# Patient Record
Sex: Female | Born: 1995 | Race: White | Hispanic: No | Marital: Single | Smoking: Never smoker
Health system: Southern US, Community
[De-identification: ages and names within clinical notes are randomized; demographics above are authoritative.]

---
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
## 2016-04-01 ENCOUNTER — Other Ambulatory Visit: Payer: Self-pay | Admitting: Family Medicine

## 2016-04-01 ENCOUNTER — Ambulatory Visit
Admission: RE | Admit: 2016-04-01 | Discharge: 2016-04-01 | Disposition: A | Discharge status: Home / Self Care | Payer: BLUE CROSS/BLUE SHIELD | Source: Ambulatory Visit | Attending: Family Medicine | Admitting: Family Medicine

## 2016-04-01 DIAGNOSIS — R059 Cough, unspecified: Secondary | ICD-10-CM

## 2016-04-01 DIAGNOSIS — R05 Cough: Secondary | ICD-10-CM

## 2016-04-01 DIAGNOSIS — R0602 Shortness of breath: Secondary | ICD-10-CM

## 2016-04-01 DIAGNOSIS — R61 Generalized hyperhidrosis: Secondary | ICD-10-CM | POA: Insufficient documentation

## 2016-04-02 LAB — CBC WITH DIFFERENTIAL/PLATELET
Basophils Absolute: 0 10*3/uL (ref 0.0–0.2)
Basos: 0 %
EOS (ABSOLUTE): 0.2 10*3/uL (ref 0.0–0.4)
Eos: 1 %
HEMATOCRIT: 38.9 % (ref 34.0–46.6)
Hemoglobin: 13.3 g/dL (ref 11.1–15.9)
IMMATURE GRANS (ABS): 0 10*3/uL (ref 0.0–0.1)
IMMATURE GRANULOCYTES: 0 %
LYMPHS: 16 %
Lymphocytes Absolute: 2.4 10*3/uL (ref 0.7–3.1)
MCH: 31.4 pg (ref 26.6–33.0)
MCHC: 34.2 g/dL (ref 31.5–35.7)
MCV: 92 fL (ref 79–97)
MONOS ABS: 0.7 10*3/uL (ref 0.1–0.9)
Monocytes: 5 %
NEUTROS ABS: 11.5 10*3/uL — AB (ref 1.4–7.0)
NEUTROS PCT: 78 %
Platelets: 253 10*3/uL (ref 150–379)
RBC: 4.24 x10E6/uL (ref 3.77–5.28)
RDW: 13.1 % (ref 12.3–15.4)
WBC: 14.9 10*3/uL — ABNORMAL HIGH (ref 3.4–10.8)

## 2016-04-02 LAB — COMPREHENSIVE METABOLIC PANEL
A/G RATIO: 1.9 (ref 1.2–2.2)
ALT: 20 IU/L (ref 0–32)
AST: 20 IU/L (ref 0–40)
Albumin: 4.6 g/dL (ref 3.5–5.5)
Alkaline Phosphatase: 78 IU/L (ref 43–101)
BUN / CREAT RATIO: 33 — AB (ref 9–23)
BUN: 22 mg/dL — ABNORMAL HIGH (ref 6–20)
Bilirubin Total: 0.6 mg/dL (ref 0.0–1.2)
CALCIUM: 9.6 mg/dL (ref 8.7–10.2)
CO2: 23 mmol/L (ref 18–29)
Chloride: 99 mmol/L (ref 96–106)
Creatinine, Ser: 0.67 mg/dL (ref 0.57–1.00)
GFR, EST AFRICAN AMERICAN: 148 mL/min/{1.73_m2} (ref >59)
GFR, EST NON AFRICAN AMERICAN: 129 mL/min/{1.73_m2} (ref >59)
GLOBULIN, TOTAL: 2.4 g/dL (ref 1.5–4.5)
Glucose: 90 mg/dL (ref 65–99)
POTASSIUM: 4.8 mmol/L (ref 3.5–5.2)
SODIUM: 140 mmol/L (ref 134–144)
TOTAL PROTEIN: 7 g/dL (ref 6.0–8.5)

## 2016-04-02 LAB — MONONUCLEOSIS SCREEN: Mono Screen: NEGATIVE

## 2016-04-02 LAB — D-DIMER, QUANTITATIVE

## 2017-04-24 ENCOUNTER — Encounter: Payer: Self-pay | Admitting: Family Medicine

## 2017-04-24 ENCOUNTER — Ambulatory Visit (INDEPENDENT_AMBULATORY_CARE_PROVIDER_SITE_OTHER): Payer: 59 | Admitting: Family Medicine

## 2017-04-24 DIAGNOSIS — M659 Synovitis and tenosynovitis, unspecified: Secondary | ICD-10-CM

## 2017-04-24 MED ORDER — DICLOFENAC SODIUM 75 MG PO TBEC: 75.0000 mg | DELAYED_RELEASE_TABLET | Freq: Two times a day (BID) | ORAL | 0 refills | Status: AC

## 2017-04-24 NOTE — Progress Notes (Signed)
Patient presents today regarding her chronic left wrist pain. Patient states that she started to develop wrist pain back in May 2018 when she started to play more golf. She went to see a physician in [place] during the summer who diagnosed her with tenosynovitis. She was given 2 steroid injections over the summer which did not seem to help much. She states that rest and PT seemed to help her some. She went to see the same physician recently while home for fall break and was told to wear her thumb spica splint and to not play golf until December. If symptoms did not improve it was discussed that she would need surgery. She has now started to feel symptoms at rest. She states that her pain is a 4 out of 10 today. She takes Ibuprofen on occasion.  ROS: Negative except mentioned above. Vitals as per Epic.  GENERAL: NAD MSK: Left Wrist - no obvious deformity, FROM, discomfort with ulnar deviation, positive Finkelstein's, NV intact NEURO: CN II-XII groslly intact   A/P: Left wrist tenosynovitis - would recommend patient see Dr. Ardine Engiehl on Monday to review her case and discuss any other options, may consider imaging, stay in thumb spica splint for the time being, Voltaren as needed, discussed above with trainer as well.

## 2017-04-28 ENCOUNTER — Ambulatory Visit
Admission: RE | Admit: 2017-04-28 | Discharge: 2017-04-28 | Disposition: A | Discharge status: Home / Self Care | Payer: 59 | Source: Ambulatory Visit | Attending: Family Medicine | Admitting: Family Medicine

## 2017-04-28 ENCOUNTER — Other Ambulatory Visit: Payer: Self-pay | Admitting: Family Medicine

## 2017-04-28 DIAGNOSIS — M65842 Other synovitis and tenosynovitis, left hand: Secondary | ICD-10-CM | POA: Diagnosis present

## 2017-04-28 DIAGNOSIS — M659 Synovitis and tenosynovitis, unspecified: Secondary | ICD-10-CM

## 2017-04-28 DIAGNOSIS — G8929 Other chronic pain: Secondary | ICD-10-CM | POA: Insufficient documentation

## 2017-04-28 DIAGNOSIS — M65932 Unspecified synovitis and tenosynovitis, left forearm: Secondary | ICD-10-CM

## 2017-04-28 DIAGNOSIS — M25532 Pain in left wrist: Secondary | ICD-10-CM | POA: Insufficient documentation

## 2017-04-28 DIAGNOSIS — R600 Localized edema: Secondary | ICD-10-CM | POA: Diagnosis not present

## 2017-04-28 DIAGNOSIS — M67844 Other specified disorders of tendon, left hand: Secondary | ICD-10-CM | POA: Insufficient documentation

## 2017-08-06 ENCOUNTER — Ambulatory Visit (INDEPENDENT_AMBULATORY_CARE_PROVIDER_SITE_OTHER): Payer: 59 | Admitting: Family Medicine

## 2017-08-06 ENCOUNTER — Encounter: Payer: Self-pay | Admitting: Family Medicine

## 2017-08-06 VITALS — Temp 98.4°F

## 2017-08-06 DIAGNOSIS — B079 Viral wart, unspecified: Secondary | ICD-10-CM

## 2017-08-06 NOTE — Progress Notes (Signed)
Patient presents today with symptoms of lesion on her left first digit and right third digit near her nail. Patient states that she has had these lesions for over a year. She has tried the over-the-counter free spray which has not helped. She denies any other place on her body with similar lesions.  ROS: Negative except mentioned above. Vitals as per Epic. GENERAL: NAD SKIN: circularraised, firm lesion on left first digit and right third digit near nailbed, no drainage from site, no significant tenderness NEURO: CN II-XII grossly intact   A/P: Lesions on fingers - appears to look like warts, will send patient to Dermatology for further evaluation and treatment.

## 2017-11-02 ENCOUNTER — Encounter: Payer: Self-pay | Admitting: Family Medicine

## 2017-11-02 ENCOUNTER — Ambulatory Visit (INDEPENDENT_AMBULATORY_CARE_PROVIDER_SITE_OTHER): Payer: BLUE CROSS/BLUE SHIELD | Admitting: Family Medicine

## 2017-11-02 VITALS — BP 112/60 | HR 74 | Temp 98.9°F | Resp 14

## 2017-11-02 DIAGNOSIS — J029 Acute pharyngitis, unspecified: Secondary | ICD-10-CM

## 2017-11-02 DIAGNOSIS — R5383 Other fatigue: Secondary | ICD-10-CM

## 2017-11-02 NOTE — Progress Notes (Signed)
Patient presents today with symptoms of sore throat, fatigue, nasal congestion. Patient states that her symptoms started last week. She states that she went to student health and was tested for strep throat which was negative. The provider suggested that she may have mono if her symptoms persisted but it was too early to test for at that time. Patient has had some decreased appetite and nausea. She has not had any abdominal pain. She did describe chills at times. She has been taking fever lowering medication occasionally. She has been able to go to class but has not been able to do any physical activity. Patient states that her boyfriend did have infectious mono. recently.  ROS: Negative except mentioned above. Vitals as per Epic. GENERAL: NAD HEENT: mild pharyngeal erythema, no exudate, no erythema of TMs, mild cervical LAD RESP: CTA B CARD: RRR ABD: positive bowel sounds, nontender, no organomegaly appreciated NEURO: CN II-XII grossly intact   A/P: Presumed Infectious Mono. - Will do labs, no physical activity for now, rest, hydration, encourage not skipping meals, schoolwork as tolerated, encourage patient to inform academic advisor/professors, Ibuprofen/Tylenol when necessary, seek medical attention if symptoms persist or worsen as discussed.will discuss with trainer.

## 2017-11-04 LAB — COMPREHENSIVE METABOLIC PANEL
ALT: 12 IU/L (ref 0–32)
AST: 14 IU/L (ref 0–40)
Albumin/Globulin Ratio: 1.8 (ref 1.2–2.2)
Albumin: 4.6 g/dL (ref 3.5–5.5)
Alkaline Phosphatase: 45 IU/L (ref 39–117)
BUN/Creatinine Ratio: 21 (ref 9–23)
BUN: 16 mg/dL (ref 6–20)
Bilirubin Total: 1 mg/dL (ref 0.0–1.2)
CO2: 23 mmol/L (ref 20–29)
CREATININE: 0.78 mg/dL (ref 0.57–1.00)
Calcium: 9.4 mg/dL (ref 8.7–10.2)
Chloride: 100 mmol/L (ref 96–106)
GFR, EST AFRICAN AMERICAN: 127 mL/min/{1.73_m2} (ref >59)
GFR, EST NON AFRICAN AMERICAN: 110 mL/min/{1.73_m2} (ref >59)
GLUCOSE: 115 mg/dL — AB (ref 65–99)
Globulin, Total: 2.5 g/dL (ref 1.5–4.5)
POTASSIUM: 4.4 mmol/L (ref 3.5–5.2)
Sodium: 139 mmol/L (ref 134–144)
TOTAL PROTEIN: 7.1 g/dL (ref 6.0–8.5)

## 2017-11-04 LAB — CBC WITH DIFFERENTIAL/PLATELET
BASOS: 0 %
Basophils Absolute: 0 10*3/uL (ref 0.0–0.2)
EOS (ABSOLUTE): 0.1 10*3/uL (ref 0.0–0.4)
Eos: 1 %
HEMOGLOBIN: 13.9 g/dL (ref 11.1–15.9)
Hematocrit: 41.9 % (ref 34.0–46.6)
IMMATURE GRANS (ABS): 0 10*3/uL (ref 0.0–0.1)
Immature Granulocytes: 0 %
LYMPHS ABS: 2 10*3/uL (ref 0.7–3.1)
LYMPHS: 25 %
MCH: 30.8 pg (ref 26.6–33.0)
MCHC: 33.2 g/dL (ref 31.5–35.7)
MCV: 93 fL (ref 79–97)
MONOCYTES: 4 %
Monocytes Absolute: 0.3 10*3/uL (ref 0.1–0.9)
NEUTROS ABS: 5.7 10*3/uL (ref 1.4–7.0)
Neutrophils: 70 %
Platelets: 293 10*3/uL (ref 150–379)
RBC: 4.52 x10E6/uL (ref 3.77–5.28)
RDW: 12.8 % (ref 12.3–15.4)
WBC: 8.1 10*3/uL (ref 3.4–10.8)

## 2017-11-04 LAB — EPSTEIN-BARR VIRUS VCA ANTIBODY PANEL
EBV Early Antigen Ab, IgG: <9 U/mL (ref 0.0–8.9)
EBV VCA IgG: 206 U/mL — ABNORMAL HIGH (ref 0.0–17.9)
EBV VCA IgM: <36 U/mL (ref 0.0–35.9)

## 2018-03-11 ENCOUNTER — Encounter: Payer: Self-pay | Admitting: Family Medicine

## 2018-03-11 ENCOUNTER — Ambulatory Visit (INDEPENDENT_AMBULATORY_CARE_PROVIDER_SITE_OTHER): Payer: 59 | Admitting: Family Medicine

## 2018-03-11 VITALS — Temp 98.1°F | Resp 14

## 2018-03-11 DIAGNOSIS — L989 Disorder of the skin and subcutaneous tissue, unspecified: Secondary | ICD-10-CM

## 2018-03-26 NOTE — Progress Notes (Signed)
Patient presents with symptoms of wart on R 3rd digit and L 1st digit. She has had them for >1 year. She would like them removed. She has tried OTC freeze products which haven't helped. She also has small ?skin tags under her bottom lip. She also admits they have been there for a long time.   ROS: Negative except mentioned above. Vitals as per Epic.  GENERAL: NAD SKIN: approx. 5 mm wart-like lesion on dorsal surface of right 3rd digit and left 1st digit, two small ?skin tags under bottom lip NEURO: CN II-XII grossly intact   A/P: Skin Lesions - will refer to Dermatology for removal, patient is not bothered with them but would like them removed for cosmetic reasons.

## 2018-04-15 ENCOUNTER — Encounter: Payer: Self-pay | Admitting: Emergency Medicine

## 2018-04-15 ENCOUNTER — Other Ambulatory Visit: Payer: Self-pay

## 2018-04-15 ENCOUNTER — Emergency Department
Admission: EM | Admit: 2018-04-15 | Discharge: 2018-04-15 | Disposition: A | Discharge status: Home / Self Care | Payer: 59 | Attending: Emergency Medicine | Admitting: Emergency Medicine

## 2018-04-15 DIAGNOSIS — R6883 Chills (without fever): Secondary | ICD-10-CM | POA: Diagnosis not present

## 2018-04-15 DIAGNOSIS — Z5321 Procedure and treatment not carried out due to patient leaving prior to being seen by health care provider: Secondary | ICD-10-CM | POA: Insufficient documentation

## 2018-04-15 DIAGNOSIS — M791 Myalgia, unspecified site: Secondary | ICD-10-CM | POA: Insufficient documentation

## 2018-04-15 LAB — COMPREHENSIVE METABOLIC PANEL
ALBUMIN: 4.5 g/dL (ref 3.5–5.0)
ALK PHOS: 44 U/L (ref 38–126)
ALT: 18 U/L (ref 0–44)
ANION GAP: 8 (ref 5–15)
AST: 17 U/L (ref 15–41)
BUN: 14 mg/dL (ref 6–20)
CALCIUM: 9.1 mg/dL (ref 8.9–10.3)
CHLORIDE: 104 mmol/L (ref 98–111)
CO2: 26 mmol/L (ref 22–32)
Creatinine, Ser: 0.68 mg/dL (ref 0.44–1.00)
GFR calc Af Amer: >60 mL/min (ref >60)
GFR calc non Af Amer: >60 mL/min (ref >60)
GLUCOSE: 90 mg/dL (ref 70–99)
POTASSIUM: 3.7 mmol/L (ref 3.5–5.1)
Sodium: 138 mmol/L (ref 135–145)
Total Bilirubin: 1 mg/dL (ref 0.3–1.2)
Total Protein: 7.3 g/dL (ref 6.5–8.1)

## 2018-04-15 LAB — CBC WITH DIFFERENTIAL/PLATELET
ABS IMMATURE GRANULOCYTES: 0.01 10*3/uL (ref 0.00–0.07)
Basophils Absolute: 0.1 10*3/uL (ref 0.0–0.1)
Basophils Relative: 1 %
Eosinophils Absolute: 0.2 10*3/uL (ref 0.0–0.5)
Eosinophils Relative: 2 %
HEMATOCRIT: 39.4 % (ref 36.0–46.0)
Hemoglobin: 13.6 g/dL (ref 12.0–15.0)
IMMATURE GRANULOCYTES: 0 %
LYMPHS ABS: 2.8 10*3/uL (ref 0.7–4.0)
LYMPHS PCT: 40 %
MCH: 31.6 pg (ref 26.0–34.0)
MCHC: 34.5 g/dL (ref 30.0–36.0)
MCV: 91.6 fL (ref 80.0–100.0)
Monocytes Absolute: 0.4 10*3/uL (ref 0.1–1.0)
Monocytes Relative: 6 %
NEUTROS ABS: 3.6 10*3/uL (ref 1.7–7.7)
NEUTROS PCT: 51 %
Platelets: 238 10*3/uL (ref 150–400)
RBC: 4.3 MIL/uL (ref 3.87–5.11)
RDW: 12 % (ref 11.5–15.5)
WBC: 7 10*3/uL (ref 4.0–10.5)
nRBC: 0 % (ref 0.0–0.2)

## 2018-04-15 NOTE — ED Triage Notes (Addendum)
Patient had MMR vaccine done today. States she has had chills with generalized body aches since then. Patient does report having syncopal episode earlier today, but it was right after having MMR vaccine given while still sitting in chair from vaccine. Patient in no acute distress.

## 2018-04-27 ENCOUNTER — Encounter: Payer: Self-pay | Admitting: Family Medicine

## 2018-04-27 ENCOUNTER — Ambulatory Visit (INDEPENDENT_AMBULATORY_CARE_PROVIDER_SITE_OTHER): Payer: Managed Care, Other (non HMO) | Admitting: Family Medicine

## 2018-04-27 VITALS — BP 92/56 | HR 86 | Temp 98.3°F | Resp 14

## 2018-04-27 DIAGNOSIS — F5001 Anorexia nervosa, restricting type: Secondary | ICD-10-CM

## 2018-05-07 ENCOUNTER — Encounter: Payer: Self-pay | Admitting: Family Medicine

## 2018-05-07 ENCOUNTER — Ambulatory Visit (INDEPENDENT_AMBULATORY_CARE_PROVIDER_SITE_OTHER): Payer: 59 | Admitting: Family Medicine

## 2018-05-07 VITALS — BP 100/57 | HR 81 | Temp 97.7°F | Resp 14

## 2018-05-07 DIAGNOSIS — F509 Eating disorder, unspecified: Secondary | ICD-10-CM

## 2018-05-07 NOTE — Progress Notes (Signed)
Patient presents today for follow-up regarding her disordered eating.  Patient states that she is done well since her last appointment with me.  She has started to advance her diet.  She admits to eating more than one meal a day now.  She is eating more salads  with protein.  She still is struggling with incorporating carbohydrates into her diet.  She did meet with Lattie Haw, the nutritionist.  Since she hasn't been playing golf this last week she states she has found more time to eat.  Patient denies any binging or purging.  She admits that since her last visit with Elberta Fortis she has not weighed herself.  She states her bowel movements are about every other day.  She denies any chest pain, shortness of breath, dizziness, headache, syncope. Patient states that she also met with Elberta Fortis in counseling services who suggested that she be seen by a disordered eating group ([facility]) in [place].  Patient states that she contacted them and did an intake over the phone.  They have not called her back to make an appointment yet.  Her parents have been hesitant for her to get formal treatment for her current issues.  She states that they are concerned that she would have to disclose any mental illness diagnosis or treatment if she were to apply to law school.  Her parents feel that she should be able to deal with these issues on her own.  She admits that her boyfriend has been very supportive of her seeking help.  ROS: Negative except mentioned above. Vitals as per Epic.  Weight was not taken. GENERAL: NAD, wearing a sweatshirt and sweatpants, normal affect RESP: CTA B CARD: RRR NEURO: CN II-XII grossly intact   A/P: Disordered Eating -recommended that I do blood work today however patient states that she would like to hold off on this until next week, when asked why she said that she did not feel "up" to getting blood work today.  Patient agreed that she would like to have Elberta Fortis talk to her parents about her  recommendation regarding her seeing the disordered eating group in [place].  She believes her parents may allow her to do this if they hear it is recommended from a counselor/therapist.  I told her I would be happy to speak with them as well if needed.  Would like patient to continue to meet with Lattie Haw for nutritional advice.  I have asked that she try to incorporate some carbohydrates into her diet that she feels comfortable with.  Would also like her to follow-up with me next week for interval changes.  Would recommend that we do blood work at her next appointment.  She will confirm with the group in [place] to see if they will be drawing any labs.  Patient is aware of the crisis line through counseling services if needed.  Patient can participate in golf activity as long as she has eaten a meal and is hydrated prior to activity.  Patient become symptomatic she is to stop athletic activity.  Patient denies any suicidal or homicidal ideations.

## 2018-05-11 ENCOUNTER — Ambulatory Visit (INDEPENDENT_AMBULATORY_CARE_PROVIDER_SITE_OTHER): Payer: Managed Care, Other (non HMO) | Admitting: Family Medicine

## 2018-05-11 VITALS — BP 115/57 | HR 84 | Temp 98.8°F | Resp 14

## 2018-05-11 DIAGNOSIS — F509 Eating disorder, unspecified: Secondary | ICD-10-CM

## 2018-05-11 MED ORDER — FLUCONAZOLE 150 MG PO TABS: 150.0000 mg | ORAL_TABLET | Freq: Once | ORAL | 0 refills | Status: AC

## 2018-05-11 NOTE — Addendum Note (Signed)
Addended by: Dione Housekeeper on: 05/11/2018 09:22 AM   Modules accepted: Level of Service

## 2018-05-12 LAB — CBC WITH DIFFERENTIAL/PLATELET
BASOS: 0 %
Basophils Absolute: 0 10*3/uL (ref 0.0–0.2)
EOS (ABSOLUTE): 0.1 10*3/uL (ref 0.0–0.4)
Eos: 2 %
Hematocrit: 40.3 % (ref 34.0–46.6)
Hemoglobin: 13.8 g/dL (ref 11.1–15.9)
IMMATURE GRANS (ABS): 0 10*3/uL (ref 0.0–0.1)
IMMATURE GRANULOCYTES: 0 %
Lymphocytes Absolute: 2 10*3/uL (ref 0.7–3.1)
Lymphs: 33 %
MCH: 31 pg (ref 26.6–33.0)
MCHC: 34.2 g/dL (ref 31.5–35.7)
MCV: 91 fL (ref 79–97)
Monocytes Absolute: 0.3 10*3/uL (ref 0.1–0.9)
Monocytes: 5 %
NEUTROS PCT: 60 %
Neutrophils Absolute: 3.7 10*3/uL (ref 1.4–7.0)
Platelets: 263 10*3/uL (ref 150–450)
RBC: 4.45 x10E6/uL (ref 3.77–5.28)
RDW: 12.2 % — ABNORMAL LOW (ref 12.3–15.4)
WBC: 6 10*3/uL (ref 3.4–10.8)

## 2018-05-12 LAB — COMPREHENSIVE METABOLIC PANEL
ALT: 14 IU/L (ref 0–32)
AST: 16 IU/L (ref 0–40)
Albumin/Globulin Ratio: 2.1 (ref 1.2–2.2)
Albumin: 4.7 g/dL (ref 3.5–5.5)
Alkaline Phosphatase: 46 IU/L (ref 39–117)
BUN/Creatinine Ratio: 16 (ref 9–23)
BUN: 14 mg/dL (ref 6–20)
Bilirubin Total: 1.1 mg/dL (ref 0.0–1.2)
CALCIUM: 9.5 mg/dL (ref 8.7–10.2)
CO2: 21 mmol/L (ref 20–29)
Chloride: 100 mmol/L (ref 96–106)
Creatinine, Ser: 0.89 mg/dL (ref 0.57–1.00)
GFR calc Af Amer: 108 mL/min/{1.73_m2} (ref >59)
GFR, EST NON AFRICAN AMERICAN: 94 mL/min/{1.73_m2} (ref >59)
Globulin, Total: 2.2 g/dL (ref 1.5–4.5)
Glucose: 107 mg/dL — ABNORMAL HIGH (ref 65–99)
Potassium: 4.1 mmol/L (ref 3.5–5.2)
Sodium: 140 mmol/L (ref 134–144)
Total Protein: 6.9 g/dL (ref 6.0–8.5)

## 2018-05-12 LAB — IRON AND TIBC
IRON SATURATION: 32 % (ref 15–55)
IRON: 117 ug/dL (ref 27–159)
Total Iron Binding Capacity: 361 ug/dL (ref 250–450)
UIBC: 244 ug/dL (ref 131–425)

## 2018-05-12 LAB — TSH+FREE T4
Free T4: 1.54 ng/dL (ref 0.82–1.77)
TSH: 2.25 u[IU]/mL (ref 0.450–4.500)

## 2018-05-12 LAB — ESTRADIOL: Estradiol: <5 pg/mL

## 2018-05-12 LAB — B12 AND FOLATE PANEL
Folate: 18.1 ng/mL (ref >3.0)
Vitamin B-12: 469 pg/mL (ref 232–1245)

## 2018-05-12 LAB — VITAMIN D 25 HYDROXY (VIT D DEFICIENCY, FRACTURES): Vit D, 25-Hydroxy: 63.8 ng/mL (ref 30.0–100.0)

## 2018-05-12 LAB — FSH/LH
FSH: 1.5 m[IU]/mL
LH: 1 m[IU]/mL

## 2018-06-14 ENCOUNTER — Ambulatory Visit (INDEPENDENT_AMBULATORY_CARE_PROVIDER_SITE_OTHER): Payer: Managed Care, Other (non HMO) | Admitting: Family Medicine

## 2018-06-14 ENCOUNTER — Encounter: Payer: Self-pay | Admitting: Family Medicine

## 2018-06-14 VITALS — BP 102/78 | HR 96 | Temp 98.1°F | Resp 14

## 2018-06-14 DIAGNOSIS — F5001 Anorexia nervosa, restricting type: Secondary | ICD-10-CM

## 2018-12-18 NOTE — Progress Notes (Signed)
Patient presents with concerns about her eating behavior. She states that for the last few months she has only been eating one meal a day. She has been avoiding carbohydrates as well. She has been feeling tired on the golf course at times which she contributes to a lack of eating. She denies any history of disordered eating in the past. She denies binging or purging. She does weigh herself daily. She admits she likes the weight she is now. She has a boyfriend who is very supportive of her seeking help. She likes the comments she gets from her friends and family regarding her current weight. She feels she has no energy to play golf though. She is concerned about long term effects given her current eating behavior. She denies alcohol or illicit drug use or taking medications to lose weight. Denies any current menstrual irregularities.   ROS: Negative except mentioned above. Vitals as per Epic.  GENERAL: NAD HEENT: no pharyngeal erythema, no exudate, no erythema of TMs, no cervical LAD RESP: CTA B CARD: RRR NEURO: CN II-XII grossly intact   A/P: Disordered Eating, Body Image Concerns - discussed some of the health issues that can arise with disordered eating, patient seemed to be concerned after discussing this, admits she wants to change her behavior, is willing to see counseling services but not a psychiatrist because she is concerned she would have to disclose this if she applied to law school. For now I am okay with her seeing Elberta Fortis in counseling services. We will discuss further evaluation and treatment once Lelan Pons has seen her. Would like her to see the nutritionist regularly as well as patient is willing to try to change her diet. Also, would like to do some blood work at our next appointment. Patient seems fine with stepping away from golf for now until she can improve her eating behavior and get her energy level back. Patient will follow-up with me after seeing Elberta Fortis. She is to seek medical  attention if any acute concerns.

## 2018-12-19 NOTE — Progress Notes (Signed)
Patient presents today for follow-up regarding her disordered eating . She says she has been doing well and has been eating 2-3 meals a day instead of one. She also is not getting on the scale anymore. Her fatigue has improved as well. She states she started changing her eating behavior because she was worried about how her restrictive behavior was affecting her health and that it could cause long term issues. Patient did decline inpatient care at the facility in [place]. She has an aunt who is a physician in [place] that she has opened up to who has been supportive with her getting help. She plans to see someone when she goes home for winter break. Her boyfriend continues to be supportive. She denies binging or purging. She admits she is happy with how her body looks. She has tried to include some carbohydrates into her meals. She has decided not to return back to golf. She has not discussed this with her coach yet but she believes he will be understanding.   ROS: Negative except mentioned above. Vitals as per Epic. GENERAL: NAD HEENT: no pharyngeal erythema, no exudate RESP: CTA B CARD: RRR NEURO: CN II-XII grossly intact   A/P: Disordered Eating - improving behavior per patient, reviewed labs from 05/2018, decreased hormone levels including estradiol, Vitamin D and iron level normal, I have encouraged her to continue seeing counseling services and/or someone at home in [place], patient agrees to do so, though she is not planning to return to golf in the spring I have encouraged her to reach out to me by email or through her trainer if she needs help, I have encouraged her to follow-up with student health services when she returns back to campus, patient addresses understanding and is appreciative.

## 2019-10-04 IMAGING — MR MR WRIST*L* W/O CM
5 of 6 series · 34 of 40 positions shown · non-contrast
Comparison: None.

CLINICAL DATA: Left wrist pain since November 2016. The patient feels a
lump on the wrist. No known injury.

EXAM:
MR OF THE LEFT WRIST WITHOUT CONTRAST
TECHNIQUE: Multiplanar, multisequence MR imaging of the left wrist was
performed. No intravenous contrast was administered.

[Series 3: T2 fat-sat · axial · 3.0mm · 0.43mm/px · z∈[-23,+55]mm · 9 of 25 slices shown]
[im 1/25]
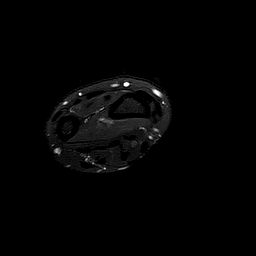
[im 4/25]
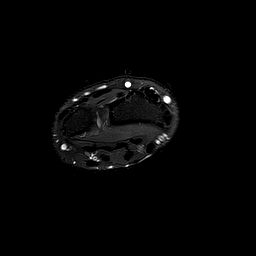
[im 7/25]
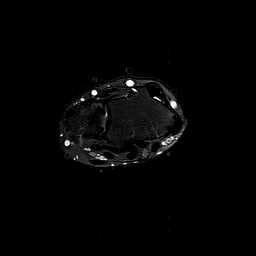
[im 10/25]
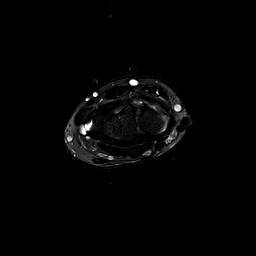
[im 13/25]
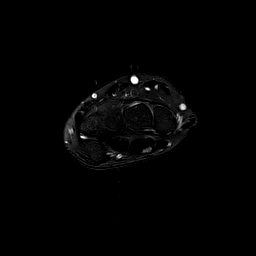
[im 16/25]
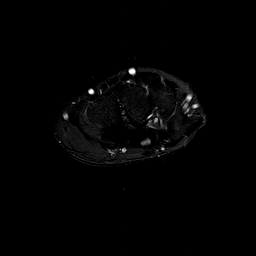
[im 19/25]
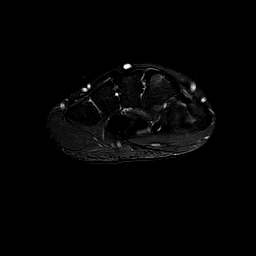
[im 22/25]
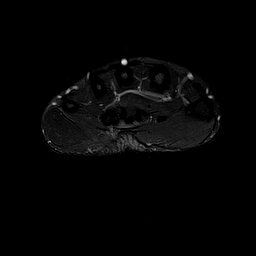
[im 25/25]
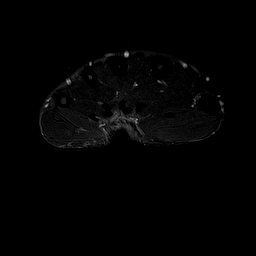

[Series 4: T1 · axial · 3.0mm · 0.43mm/px · z∈[-23,+55]mm · 8 of 25 slices shown (1 of 2)]
[im 1/25]
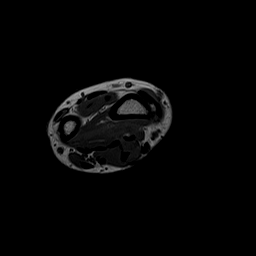
[im 4/25]
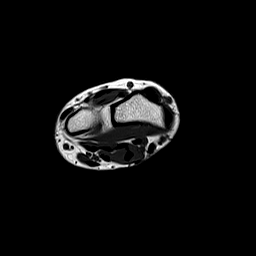
[im 7/25]
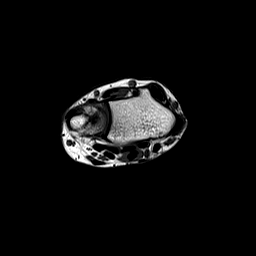
[im 11/25]
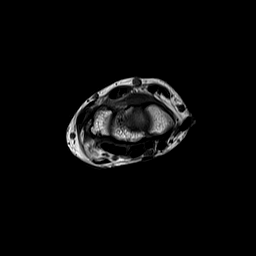
[im 14/25]
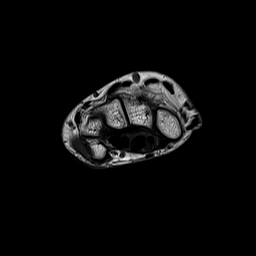
[im 18/25]
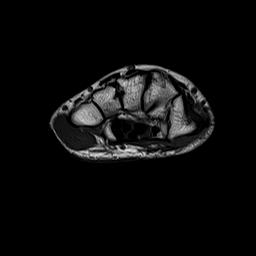
[im 21/25]
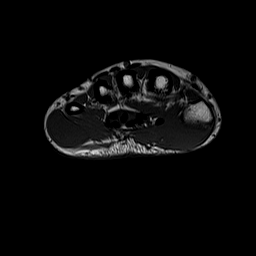
[im 25/25]
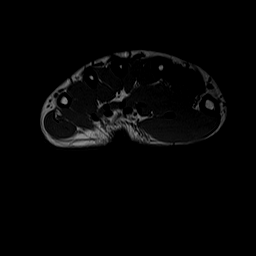

[Series 5: T1 · coronal · 3.0mm · 0.43mm/px · 4 of 17 slices shown (2 of 2)]
[im 1/17]
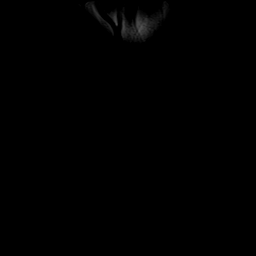
[im 5/17]
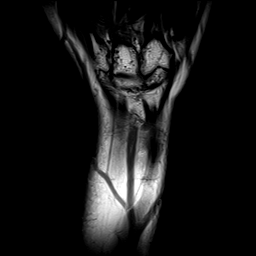
[im 9/17]
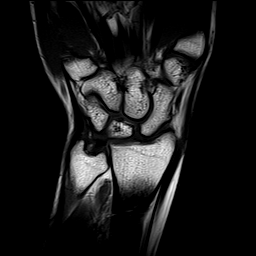
[im 13/17]
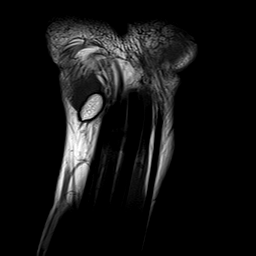

[Series 6: PD fat-sat · coronal · 3.0mm · 0.21mm/px · 5 of 17 slices shown (1 of 2)]
[im 1/17]
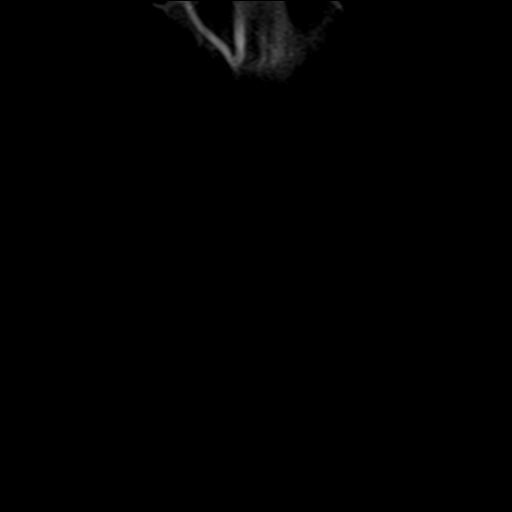
[im 5/17]
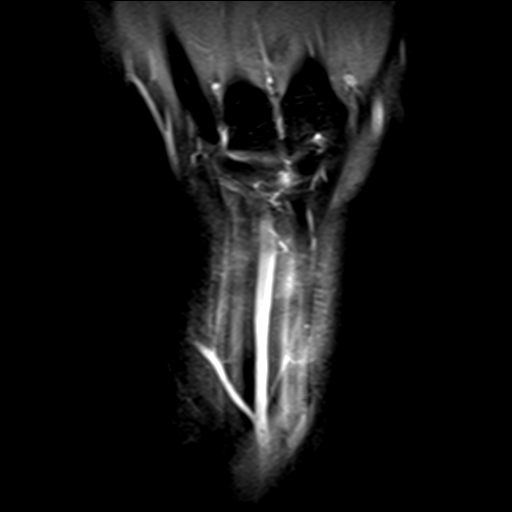
[im 9/17]
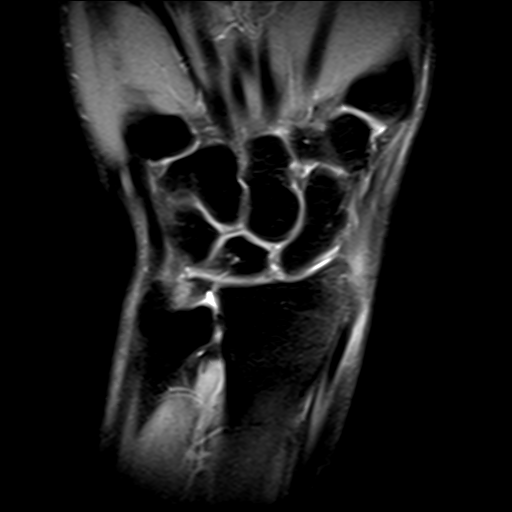
[im 13/17]
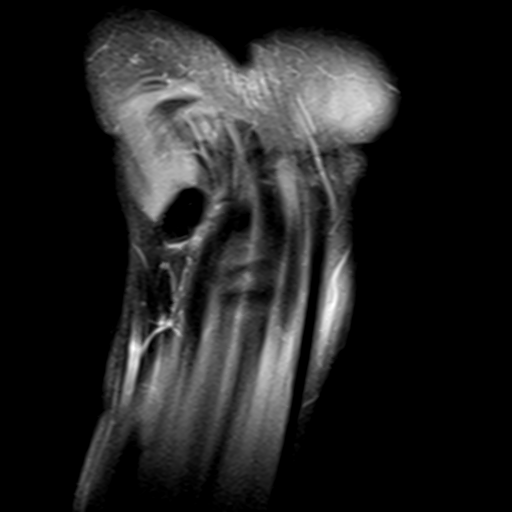
[im 17/17]
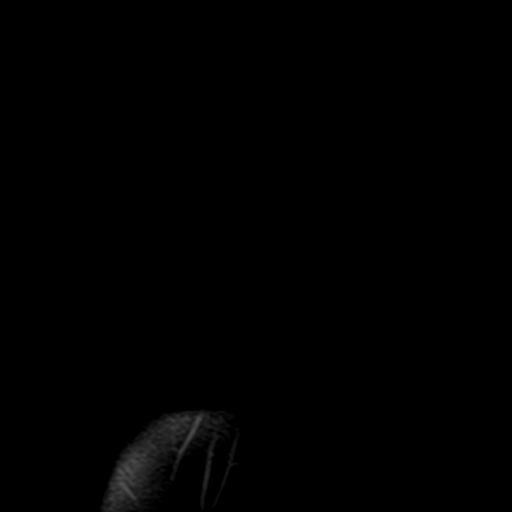

[Series 7: PD fat-sat · sagittal · 3.0mm · 0.21mm/px · 8 of 26 slices shown (2 of 2)]
[im 1/26]
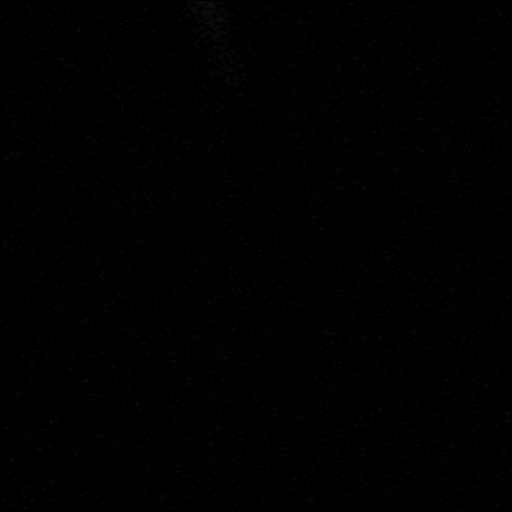
[im 4/26]
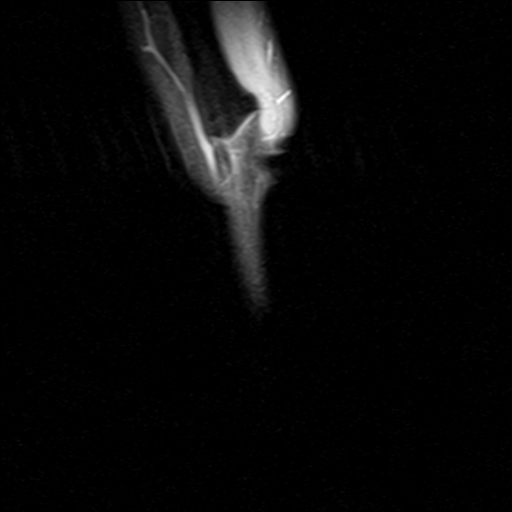
[im 8/26]
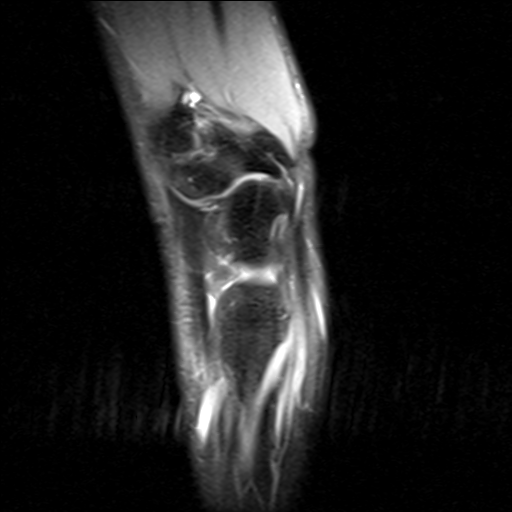
[im 11/26]
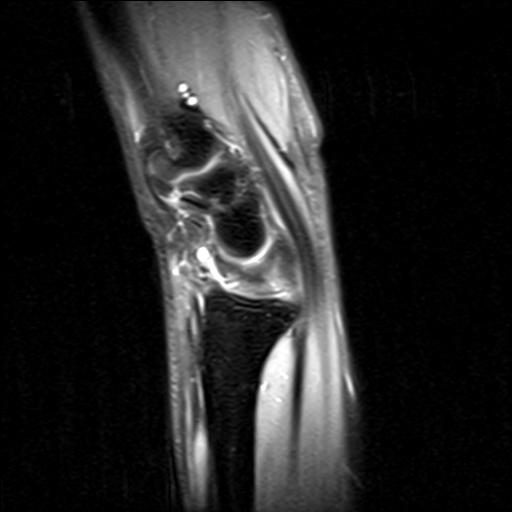
[im 15/26]
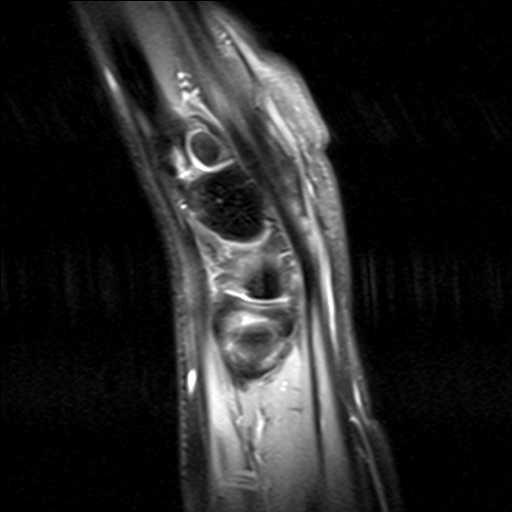
[im 18/26]
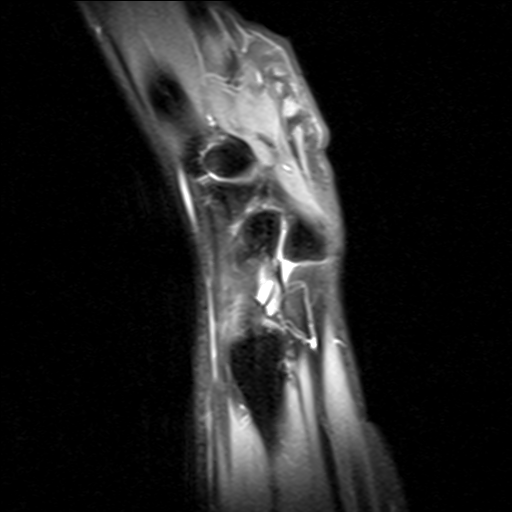
[im 22/26]
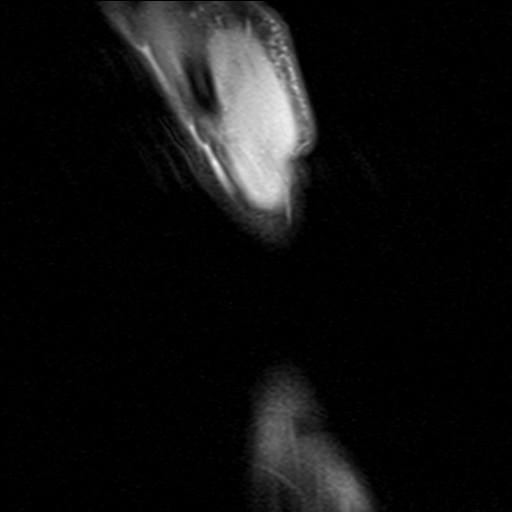
[im 26/26]
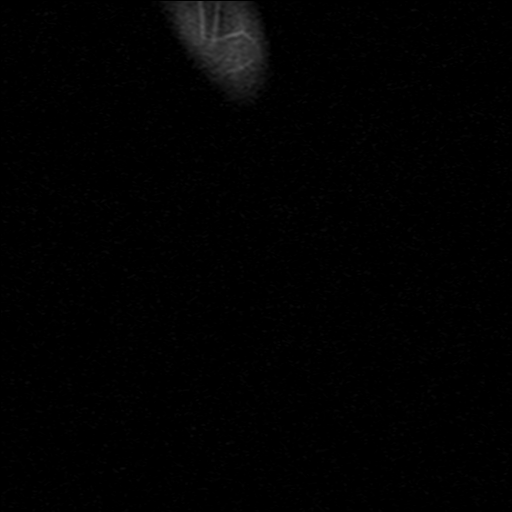

[34 of 40 positions shown; findings below may reference images not displayed]

FINDINGS: Ligaments: Intact.

Triangular fibrocartilage: Intact.

Tendons: Intact.

Carpal tunnel/median nerve: Normal.

Guyon's canal: Normal.

Joint/cartilage: Unremarkable.

Bones/carpal alignment: Bony prominences are seen off of the bases
of the second and third metacarpals. There is mild marrow edema in
the ulnar aspect of the base of the second metacarpal. Ulnar minus
variance is noted.

Other: A cyst measuring 0.5 cm AP x 0.9 cm transverse x 0.3 cm
craniocaudal is seen dorsal to the articulation of the lunate and
triquetrum.
IMPRESSION: Bony prominence off the bases of the second and third metacarpals
compatible with carpal bossing. Mild marrow edema is seen in the
base of the second metacarpal which may be degenerative in nature.
Plain films or CT of the wrist would be better for evaluation of
this finding.

Small cyst dorsal to the articulation of the scaphoid and lunate
consistent with a ganglion.
